# Patient Record
Sex: Male | Born: 1970 | Race: White | Hispanic: No | Smoking: Never smoker
Health system: Southern US, Community
[De-identification: ages and names within clinical notes are randomized; demographics above are authoritative.]

---

## 2001-07-09 ENCOUNTER — Emergency Department (HOSPITAL_COMMUNITY): Admission: EM | Admit: 2001-07-09 | Discharge: 2001-07-09 | Payer: Self-pay | Admitting: Emergency Medicine

## 2001-07-09 ENCOUNTER — Encounter: Payer: Self-pay | Admitting: Emergency Medicine

## 2004-04-19 ENCOUNTER — Ambulatory Visit (HOSPITAL_BASED_OUTPATIENT_CLINIC_OR_DEPARTMENT_OTHER): Admission: RE | Admit: 2004-04-19 | Discharge: 2004-04-19 | Payer: Self-pay | Admitting: Orthopedic Surgery

## 2006-04-29 ENCOUNTER — Ambulatory Visit (HOSPITAL_BASED_OUTPATIENT_CLINIC_OR_DEPARTMENT_OTHER): Admission: RE | Admit: 2006-04-29 | Discharge: 2006-04-29 | Payer: Self-pay | Admitting: Orthopedic Surgery

## 2009-04-28 ENCOUNTER — Emergency Department (HOSPITAL_COMMUNITY): Admission: EM | Admit: 2009-04-28 | Discharge: 2009-04-28 | Payer: Self-pay | Admitting: Family Medicine

## 2010-06-05 LAB — POCT URINALYSIS DIP (DEVICE)
Bilirubin Urine: NEGATIVE
Glucose, UA: NEGATIVE mg/dL
Hgb urine dipstick: NEGATIVE
Ketones, ur: NEGATIVE mg/dL
Nitrite: NEGATIVE
Protein, ur: NEGATIVE mg/dL
Specific Gravity, Urine: <=1.005 (ref 1.005–1.030)
Urobilinogen, UA: 0.2 mg/dL (ref 0.0–1.0)
pH: 6.5 (ref 5.0–8.0)

## 2010-06-05 LAB — POCT I-STAT, CHEM 8
BUN: 14 mg/dL (ref 6–23)
Calcium, Ion: 1.19 mmol/L (ref 1.12–1.32)
Chloride: 104 mEq/L (ref 96–112)
Creatinine, Ser: 1.1 mg/dL (ref 0.4–1.5)
Glucose, Bld: 85 mg/dL (ref 70–99)
HCT: 45 % (ref 39.0–52.0)
Hemoglobin: 15.3 g/dL (ref 13.0–17.0)
Potassium: 3.9 mEq/L (ref 3.5–5.1)
Sodium: 138 mEq/L (ref 135–145)
TCO2: 30 mmol/L (ref 0–100)

## 2010-06-05 LAB — CK TOTAL AND CKMB (NOT AT ARMC)
CK, MB: 2.2 ng/mL (ref 0.3–4.0)
Relative Index: 1.2 (ref 0.0–2.5)
Total CK: 185 U/L (ref 7–232)

## 2010-08-02 NOTE — Op Note (Signed)
NAME:  Kenneth Oliver, Kenneth Oliver                ACCOUNT NO.:  [id number]   MEDICAL RECORD NO.:  [id number]          PATIENT TYPE:  AMB   LOCATION:  DSC                          FACILITY:  [facility]   PHYSICIAN:  Harvie Junior, M.D.   DATE OF BIRTH:  18-Jun-1970   DATE OF PROCEDURE:  04/29/2006  DATE OF DISCHARGE:                               OPERATIVE REPORT   PREOPERATIVE DIAGNOSIS:  Medial meniscal tear.   POSTOPERATIVE DIAGNOSES:  1. Medial meniscal tear.  2. Medial-shelf plica.  3. Chondromalacia of patella.   PRINCIPLE PROCEDURES:  1. Partial posterior-horn medial meniscectomy.  2. Debridement of medial-shelf plica.  3. Debridement of chondromalacia of patella.   SURGEON:  Harvie Junior, M.D.   ASSISTANT:  Marshia Ly, P.A.   ANESTHESIA:  General.   BRIEF HISTORY:  [name] is a 41 year old male with a long history of  having had a right knee medial meniscal tear.  He ultimately came in  with symptoms very similar to the right side.  He felt that this needed  to be addressed surgically and he was taken to the operating room for  operative knee arthroscopy.   PROCEDURES:  The patient was taken to the operating room, and after  adequate anesthesia was obtained with general anesthetic, the patient  was placed on the operating table.  The left leg was prepped and draped  in the usual sterile fashion.  Following this, routine arthroscopic  examination of the knee revealed that there was an obvious posterior-  horn medial meniscal tear.  There was a large medial-shelf plica  blocking entrance into the medial compartment.  The medial-shelf plica  was debrided with the suction shaver back to a smooth rim, back to the  wall, and then the medial meniscal tear was debrided.  In the medial  compartment, the medial femoral condyle had minimal chondromalacia.  The  ACL looked good.  The lateral side looked good.  There was some grade 2  change in the patellofemoral joint, which was debrided.   Patellar  tracking was midline; no need for a lateral release.  At this  point, the knee was copiously and thoroughly irrigated and suctioned  dry.  The arthroscopic portals were closed with a Band-Aid.  A sterile  compressive dressing was applied, and the patient was taken to recovery,  where she was noted in satisfactory condition.  Estimated blood loss for  the procedure was negligible.      Harvie Junior, M.D.  Electronically Signed     JLG/MEDQ  D:  04/29/2006  T:  04/29/2006  Job:  [id number]

## 2014-11-23 ENCOUNTER — Emergency Department (HOSPITAL_COMMUNITY)
Admission: EM | Admit: 2014-11-23 | Discharge: 2014-11-23 | Disposition: A | Discharge status: Home / Self Care | Payer: BLUE CROSS/BLUE SHIELD | Attending: Emergency Medicine | Admitting: Emergency Medicine

## 2014-11-23 ENCOUNTER — Emergency Department (HOSPITAL_COMMUNITY): Payer: BLUE CROSS/BLUE SHIELD

## 2014-11-23 ENCOUNTER — Encounter (HOSPITAL_COMMUNITY): Payer: Self-pay | Admitting: Emergency Medicine

## 2014-11-23 DIAGNOSIS — R42 Dizziness and giddiness: Secondary | ICD-10-CM | POA: Diagnosis not present

## 2014-11-23 DIAGNOSIS — Z79899 Other long term (current) drug therapy: Secondary | ICD-10-CM | POA: Diagnosis not present

## 2014-11-23 DIAGNOSIS — R55 Syncope and collapse: Secondary | ICD-10-CM

## 2014-11-23 LAB — URINALYSIS, ROUTINE W REFLEX MICROSCOPIC
Bilirubin Urine: NEGATIVE
GLUCOSE, UA: NEGATIVE mg/dL
Hgb urine dipstick: NEGATIVE
Ketones, ur: NEGATIVE mg/dL
LEUKOCYTES UA: NEGATIVE
NITRITE: NEGATIVE
PH: 7 (ref 5.0–8.0)
Protein, ur: NEGATIVE mg/dL
SPECIFIC GRAVITY, URINE: 1.007 (ref 1.005–1.030)
Urobilinogen, UA: 0.2 mg/dL (ref 0.0–1.0)

## 2014-11-23 LAB — CBC
HEMATOCRIT: 42.4 % (ref 39.0–52.0)
Hemoglobin: 14.4 g/dL (ref 13.0–17.0)
MCH: 30.1 pg (ref 26.0–34.0)
MCHC: 34 g/dL (ref 30.0–36.0)
MCV: 88.7 fL (ref 78.0–100.0)
Platelets: 201 10*3/uL (ref 150–400)
RBC: 4.78 MIL/uL (ref 4.22–5.81)
RDW: 12.4 % (ref 11.5–15.5)
WBC: 7.3 10*3/uL (ref 4.0–10.5)

## 2014-11-23 LAB — BASIC METABOLIC PANEL
ANION GAP: 8 (ref 5–15)
BUN: 12 mg/dL (ref 6–20)
CO2: 25 mmol/L (ref 22–32)
CREATININE: 1.22 mg/dL (ref 0.61–1.24)
Calcium: 9.3 mg/dL (ref 8.9–10.3)
Chloride: 105 mmol/L (ref 101–111)
GFR calc Af Amer: >60 mL/min (ref >60)
GFR calc non Af Amer: >60 mL/min (ref >60)
GLUCOSE: 108 mg/dL — AB (ref 65–99)
Potassium: 3.8 mmol/L (ref 3.5–5.1)
Sodium: 138 mmol/L (ref 135–145)

## 2014-11-23 LAB — CBG MONITORING, ED: Glucose-Capillary: 119 mg/dL — ABNORMAL HIGH (ref 65–99)

## 2014-11-23 LAB — HEPATIC FUNCTION PANEL
ALBUMIN: 3.9 g/dL (ref 3.5–5.0)
ALK PHOS: 64 U/L (ref 38–126)
ALT: 42 U/L (ref 17–63)
AST: 33 U/L (ref 15–41)
BILIRUBIN TOTAL: 0.5 mg/dL (ref 0.3–1.2)
Bilirubin, Direct: 0.2 mg/dL (ref 0.1–0.5)
Indirect Bilirubin: 0.3 mg/dL (ref 0.3–0.9)
TOTAL PROTEIN: 6.2 g/dL — AB (ref 6.5–8.1)

## 2014-11-23 LAB — I-STAT TROPONIN, ED: TROPONIN I, POC: 0 ng/mL (ref 0.00–0.08)

## 2014-11-23 MED ORDER — SODIUM CHLORIDE 0.9 % IV BOLUS (SEPSIS)
500.0000 mL | Freq: Once | INTRAVENOUS | Status: AC
  Administered 2014-11-23: 500 mL via INTRAVENOUS

## 2014-11-23 NOTE — ED Notes (Signed)
MD at the bedside  

## 2014-11-23 NOTE — ED Notes (Signed)
Per EMS, patient was standing up, leaning on a car and "passed out".   Patient states he felt like things "was closing in".   Patient states feels fine now.   Patient states no loss of continence, no other symptoms.   Patient states this did happen to him previously when he had "heat exhaustion".

## 2014-11-23 NOTE — Discharge Instructions (Signed)
Follow up with a family md in 1-2 weeks.  Return if any problems.  Have a healthy breakfast daily

## 2014-11-23 NOTE — ED Provider Notes (Signed)
CSN: [id number]     Arrival date & time 11/23/14  0758 History   First MD Initiated Contact with Patient 11/23/14 [phone]     Chief Complaint  Patient presents with  . Loss of Consciousness     (Consider location/radiation/quality/duration/timing/severity/associated sxs/prior Treatment) Patient is a 44 y.o. male presenting with syncope. The history is provided by the patient (the pt states he was at work today and became dizzy and then passed out).  Loss of Consciousness Episode history:  Single Most recent episode:  Today Timing:  Rare Chronicity:  New Context: not blood draw   Associated symptoms: dizziness   Associated symptoms: no chest pain, no headaches and no seizures     History reviewed. No pertinent past medical history. History reviewed. No pertinent past surgical history. No family history on file. Social History  Substance Use Topics  . Smoking status: Never Smoker   . Smokeless tobacco: Current User    Types: Snuff  . Alcohol Use: No    Review of Systems  Constitutional: Negative for appetite change and fatigue.  HENT: Negative for congestion, ear discharge and sinus pressure.   Eyes: Negative for discharge.  Respiratory: Negative for cough.   Cardiovascular: Positive for syncope. Negative for chest pain.  Gastrointestinal: Negative for abdominal pain and diarrhea.  Genitourinary: Negative for frequency and hematuria.  Musculoskeletal: Negative for back pain.  Skin: Negative for rash.  Neurological: Positive for dizziness. Negative for seizures and headaches.  Psychiatric/Behavioral: Negative for hallucinations.      Allergies  Review of patient's allergies indicates no known allergies.  Home Medications   Prior to Admission medications   Medication Sig Start Date End Date Taking? Authorizing Provider  Multiple Vitamins-Minerals (MULTIVITAMIN WITH MINERALS) tablet Take 1 tablet by mouth daily.   Yes Historical Provider, MD   BP 106/70 mmHg  Pulse 72   Temp(Src) 98.6 F (37 C) (Oral)  Resp 22  Ht 5\' 9"  (1.753 m)  Wt 175 lb (79.379 kg)  BMI 25.83 kg/m2  SpO2 96% Physical Exam  Constitutional: He is oriented to person, place, and time. He appears well-developed.  HENT:  Head: Normocephalic.  Eyes: Conjunctivae and EOM are normal. No scleral icterus.  Neck: Neck supple. No thyromegaly present.  Cardiovascular: Normal rate and regular rhythm.  Exam reveals no gallop and no friction rub.   No murmur heard. Pulmonary/Chest: No stridor. He has no wheezes. He has no rales. He exhibits no tenderness.  Abdominal: He exhibits no distension. There is no tenderness. There is no rebound.  Musculoskeletal: Normal range of motion. He exhibits no edema.  Lymphadenopathy:    He has no cervical adenopathy.  Neurological: He is oriented to person, place, and time. He exhibits normal muscle tone. Coordination normal.  Skin: No rash noted. No erythema.  Psychiatric: He has a normal mood and affect. His behavior is normal.    ED Course  Procedures (including critical care time) Labs Review Labs Reviewed  BASIC METABOLIC PANEL - Abnormal; Notable for the following:    Glucose, Bld 108 (*)    All other components within normal limits  HEPATIC FUNCTION PANEL - Abnormal; Notable for the following:    Total Protein 6.2 (*)    All other components within normal limits  CBC  URINALYSIS, ROUTINE W REFLEX MICROSCOPIC (NOT AT [facility])  CBG MONITORING, ED  Rosezena Sensor, ED    Imaging Review Dg Chest 2 View  11/23/2014   CLINICAL DATA:  44 year old male with syncope this morning.  Initial encounter.  EXAM: CHEST  2 VIEW  COMPARISON:  [facility] CT Abdomen and Pelvis 05/19/2006.  FINDINGS: Seated AP and lateral views of the chest. Normal cardiac size and mediastinal contours. Visualized tracheal air column is within normal limits. The lungs are clear. No pneumothorax or pleural effusion. No acute osseous abnormality identified.   IMPRESSION: Negative, no acute cardiopulmonary abnormality.   Electronically Signed   By: Odessa Fleming M.D.   On: 11/23/2014 09:06   Ct Head Wo Contrast  11/23/2014   CLINICAL DATA:  Syncopal episode this morning.  EXAM: CT HEAD WITHOUT CONTRAST  TECHNIQUE: Contiguous axial images were obtained from the base of the skull through the vertex without intravenous contrast.  COMPARISON:  None.  FINDINGS: No mass effect or midline shift. No evidence of acute intracranial hemorrhage, or infarction. No abnormal extra-axial fluid collections. Gray-white matter differentiation is normal. Basal cisterns are preserved.  No depressed skull fractures. Visualized paranasal sinuses and mastoid air cells are not opacified.  IMPRESSION: No acute intracranial abnormality.   Electronically Signed   By: Ted Mcalpine M.D.   On: 11/23/2014 08:52   I have personally reviewed and evaluated these images and lab results as part of my medical decision-making.   EKG Interpretation None      MDM   Final diagnoses:  Syncope and collapse    Pt had unremarkable labs and ekg,  Pt feels much better.  Dx syncope, possibly vaso vagal.  Pt to follow up with pcp    Bethann Berkshire, MD 11/23/14 1153

## 2016-02-21 ENCOUNTER — Ambulatory Visit: Payer: BLUE CROSS/BLUE SHIELD | Admitting: Psychology

## 2016-03-06 ENCOUNTER — Ambulatory Visit: Payer: BLUE CROSS/BLUE SHIELD | Admitting: Psychology

## 2016-09-20 IMAGING — CT CT HEAD W/O CM
2 series · 16 of 30 positions shown, 18 images · non-contrast
Comparison: None.

CLINICAL DATA: Syncopal episode this morning.

EXAM:
CT HEAD WITHOUT CONTRAST
TECHNIQUE: Contiguous axial images were obtained from the base of the skull
through the vertex without intravenous contrast.

[Series 201: head w/o, idose (1) · axial · non-contrast · 0.49mm/px · z∈[+160,+275]mm · 8 of 31 slices shown, 10 images]
[im 4/31  brain]
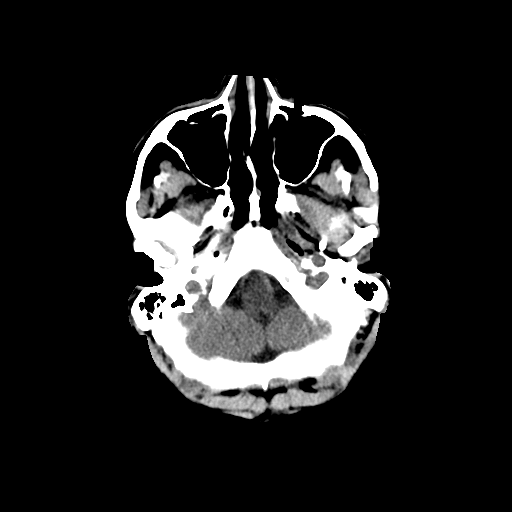
[im 4/31  bone]
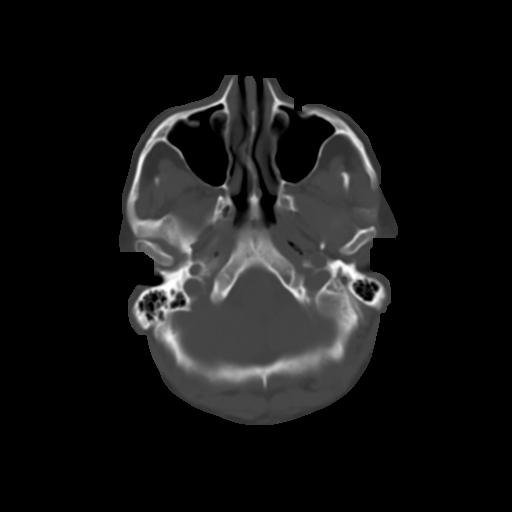
[im 7/31  brain]
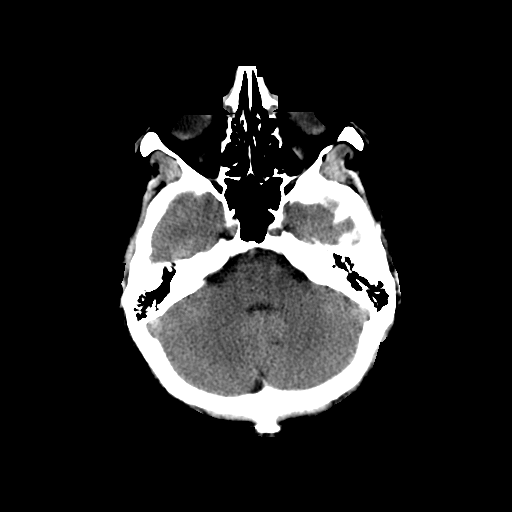
[im 11/31  brain]
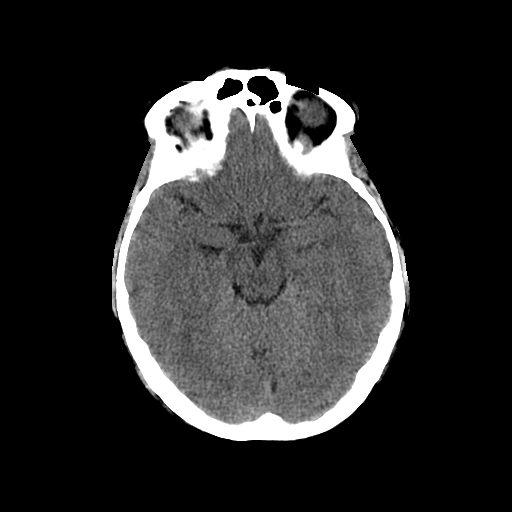
[im 14/31  brain]
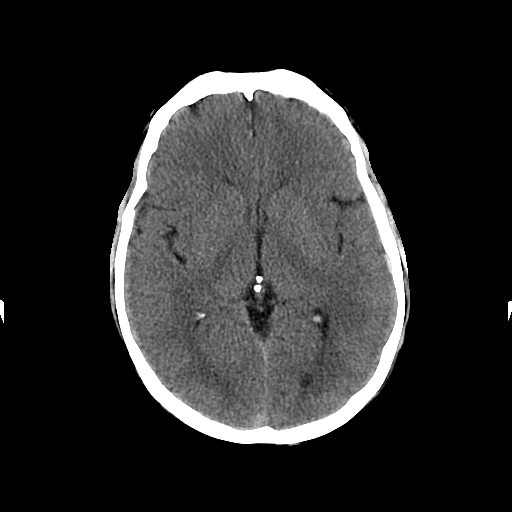
[im 17/31  brain]
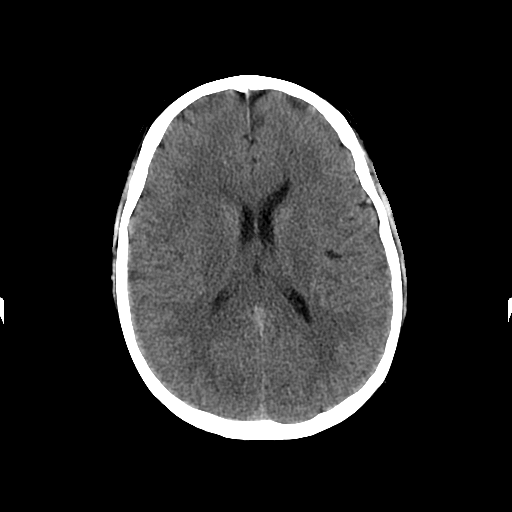
[im 17/31  bone]
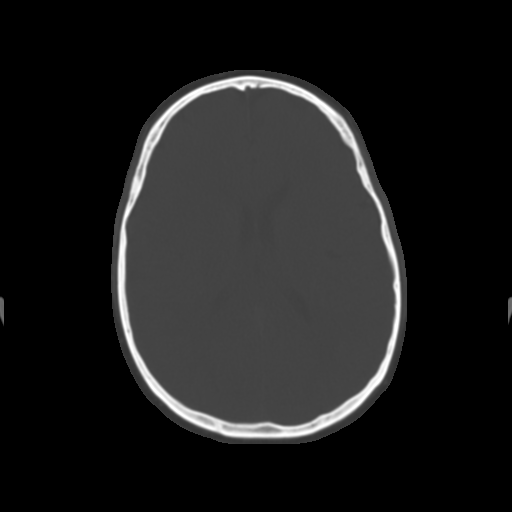
[im 21/31  brain]
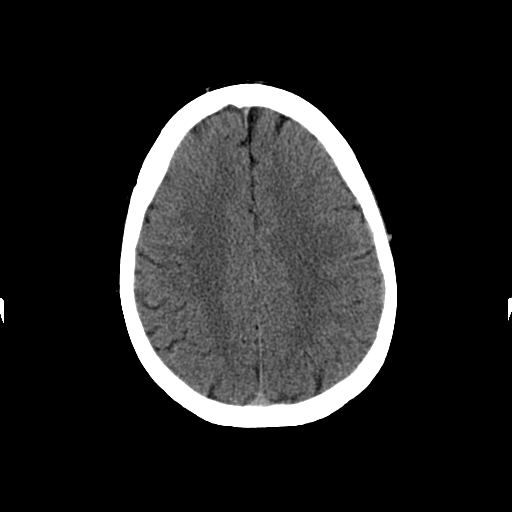
[im 24/31  brain]
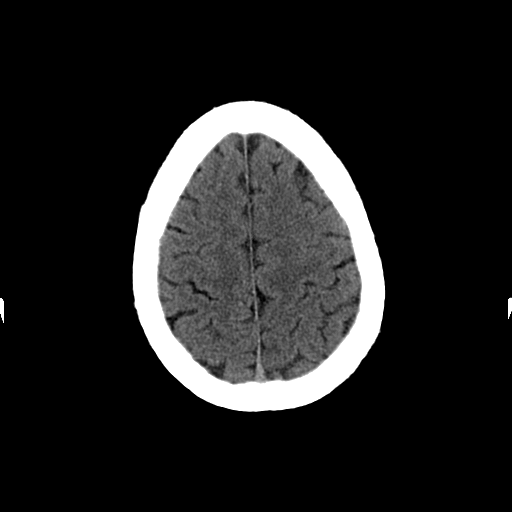
[im 27/31  brain]
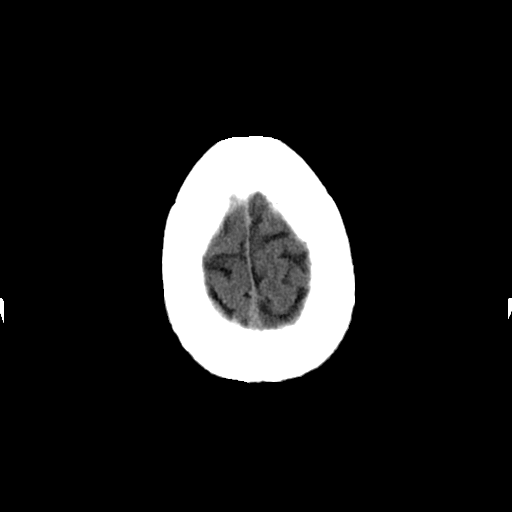

[Series 202: head w/o bone, idose (1) · axial · non-contrast · 0.49mm/px · z∈[+158,+278]mm · 8 of 62 slices shown]
[im 7/62  bone]
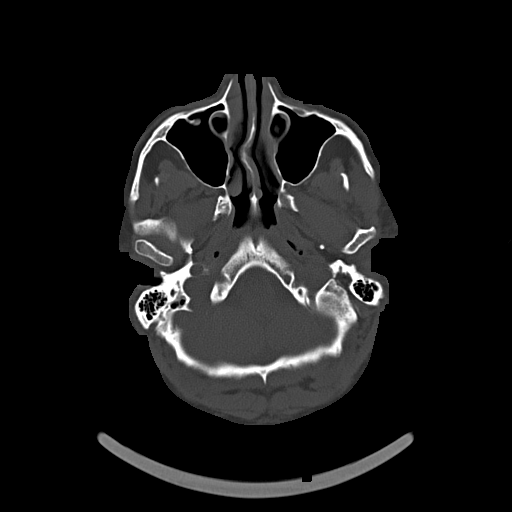
[im 13/62  bone]
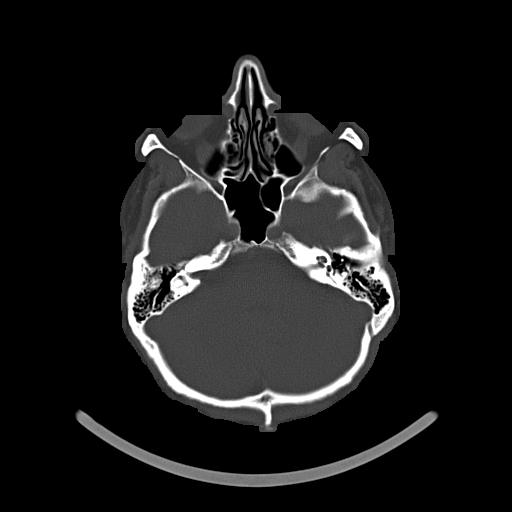
[im 20/62  bone]
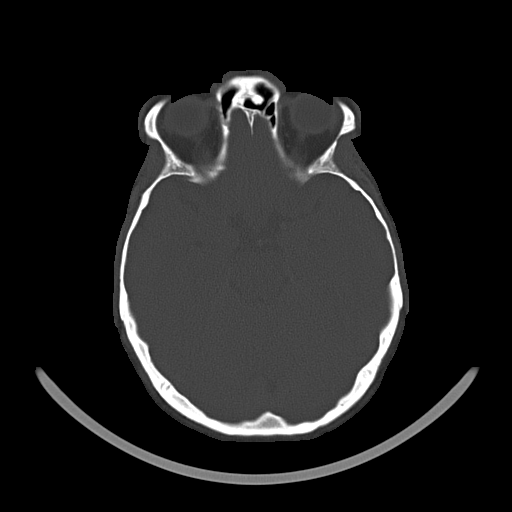
[im 26/62  bone]
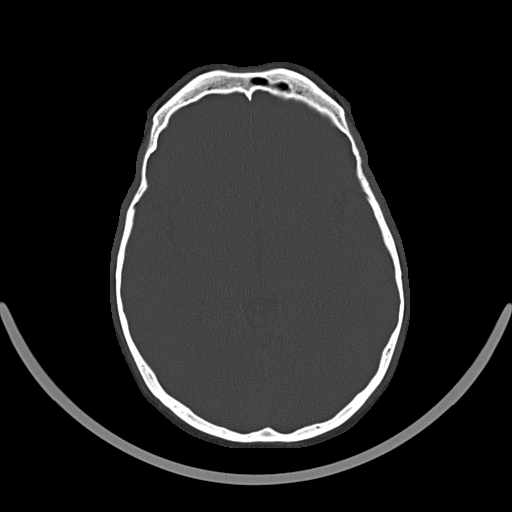
[im 36/62  bone]
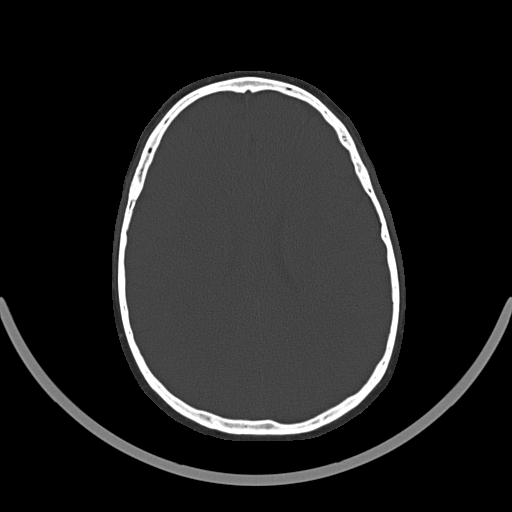
[im 42/62  bone]
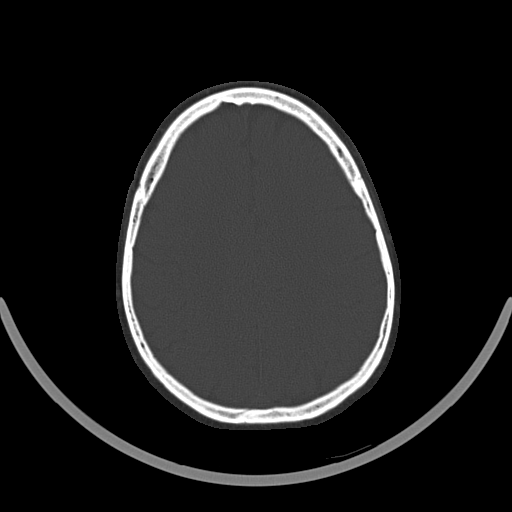
[im 49/62  bone]
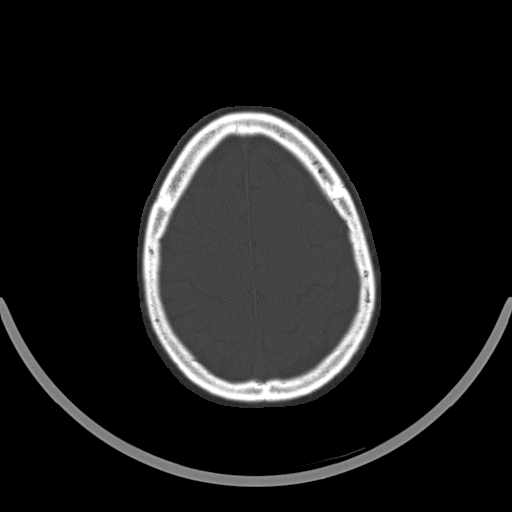
[im 55/62  bone]
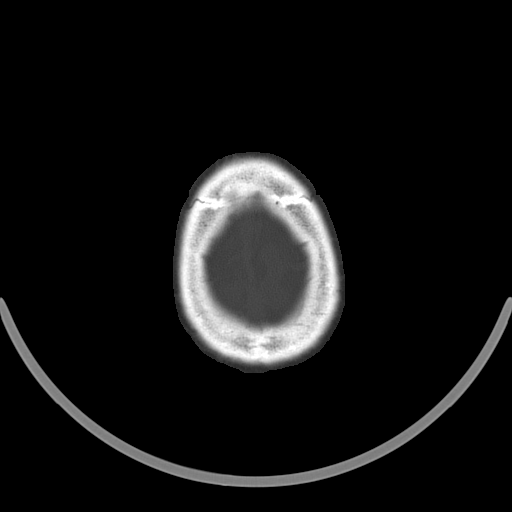

[16 of 30 positions shown; findings below may reference images not displayed]

FINDINGS: No mass effect or midline shift. No evidence of acute intracranial
hemorrhage, or infarction. No abnormal extra-axial fluid
collections. Gray-white matter differentiation is normal. Basal
cisterns are preserved.

No depressed skull fractures. Visualized paranasal sinuses and
mastoid air cells are not opacified.
IMPRESSION: No acute intracranial abnormality.
